# Patient Record
Sex: Female | Born: 1975 | Race: Black or African American | Hispanic: No | Marital: Single | Smoking: Current every day smoker
Health system: Southern US, Community
[De-identification: ages and names within clinical notes are randomized; demographics above are authoritative.]

## PROBLEM LIST (undated history)

## (undated) DIAGNOSIS — J45909 Unspecified asthma, uncomplicated: Secondary | ICD-10-CM

---

## 2006-06-26 ENCOUNTER — Emergency Department (HOSPITAL_COMMUNITY): Admission: EM | Admit: 2006-06-26 | Discharge: 2006-06-26 | Payer: Self-pay | Admitting: Emergency Medicine

## 2012-07-01 ENCOUNTER — Emergency Department (HOSPITAL_COMMUNITY)
Admission: EM | Admit: 2012-07-01 | Discharge: 2012-07-02 | Disposition: A | Discharge status: Home / Self Care | Payer: Self-pay | Attending: Emergency Medicine | Admitting: Emergency Medicine

## 2012-07-01 ENCOUNTER — Emergency Department (HOSPITAL_COMMUNITY): Payer: Self-pay

## 2012-07-01 ENCOUNTER — Encounter (HOSPITAL_COMMUNITY): Payer: Self-pay | Admitting: *Deleted

## 2012-07-01 DIAGNOSIS — F17200 Nicotine dependence, unspecified, uncomplicated: Secondary | ICD-10-CM | POA: Insufficient documentation

## 2012-07-01 DIAGNOSIS — J189 Pneumonia, unspecified organism: Secondary | ICD-10-CM | POA: Insufficient documentation

## 2012-07-01 DIAGNOSIS — J45909 Unspecified asthma, uncomplicated: Secondary | ICD-10-CM | POA: Insufficient documentation

## 2012-07-01 LAB — CBC WITH DIFFERENTIAL/PLATELET
Basophils Absolute: 0 10*3/uL (ref 0.0–0.1)
Basophils Relative: 0 % (ref 0–1)
MCHC: 33.9 g/dL (ref 30.0–36.0)
Neutro Abs: 6.1 10*3/uL (ref 1.7–7.7)
Neutrophils Relative %: 88 % — ABNORMAL HIGH (ref 43–77)
Platelets: 229 10*3/uL (ref 150–400)
RDW: 13.9 % (ref 11.5–15.5)

## 2012-07-01 LAB — COMPREHENSIVE METABOLIC PANEL
AST: 33 U/L (ref 0–37)
Albumin: 3.6 g/dL (ref 3.5–5.2)
Alkaline Phosphatase: 35 U/L — ABNORMAL LOW (ref 39–117)
Chloride: 99 mEq/L (ref 96–112)
Creatinine, Ser: 0.86 mg/dL (ref 0.50–1.10)
Potassium: 3.2 mEq/L — ABNORMAL LOW (ref 3.5–5.1)
Total Bilirubin: 0.3 mg/dL (ref 0.3–1.2)

## 2012-07-01 MED ORDER — ALBUTEROL SULFATE (5 MG/ML) 0.5% IN NEBU
2.5000 mg | INHALATION_SOLUTION | Freq: Once | RESPIRATORY_TRACT | Status: DC
Start: 2012-07-01 — End: 2012-07-01

## 2012-07-01 MED ORDER — IPRATROPIUM BROMIDE 0.02 % IN SOLN
RESPIRATORY_TRACT | Status: AC
  Administered 2012-07-01: 0.5 mg
  Filled 2012-07-01: qty 2.5

## 2012-07-01 MED ORDER — ALBUTEROL SULFATE (5 MG/ML) 0.5% IN NEBU
INHALATION_SOLUTION | RESPIRATORY_TRACT | Status: AC
  Administered 2012-07-01: 5 mg
  Filled 2012-07-01: qty 1

## 2012-07-01 MED ORDER — DEXTROSE 5 % IV SOLN
500.0000 mg | INTRAVENOUS | Status: DC
  Administered 2012-07-02: 500 mg via INTRAVENOUS
  Filled 2012-07-01: qty 500

## 2012-07-01 MED ORDER — DEXTROSE 5 % IV SOLN
1.0000 g | INTRAVENOUS | Status: DC
  Administered 2012-07-01: 1 g via INTRAVENOUS
  Filled 2012-07-01: qty 10

## 2012-07-01 MED ORDER — METHYLPREDNISOLONE SODIUM SUCC 125 MG IJ SOLR
125.0000 mg | Freq: Once | INTRAMUSCULAR | Status: AC
  Administered 2012-07-01: 125 mg via INTRAVENOUS
  Filled 2012-07-01: qty 2

## 2012-07-01 MED ORDER — IPRATROPIUM BROMIDE 0.02 % IN SOLN: 0.5000 mg | Freq: Once | RESPIRATORY_TRACT | Status: DC

## 2012-07-01 MED ORDER — IOHEXOL 350 MG/ML SOLN
150.0000 mL | Freq: Once | INTRAVENOUS | Status: AC | PRN
  Administered 2012-07-01: 130 mL via INTRAVENOUS

## 2012-07-01 NOTE — ED Notes (Signed)
Patient placed on O2 2l/min via [place].

## 2012-07-01 NOTE — ED Notes (Signed)
Bed:WA19<BR> Expected date:<BR> Expected time:<BR> Means of arrival:<BR> Comments:<BR> triage

## 2012-07-01 NOTE — ED Notes (Signed)
Pt c/o shortness of breath and vomiting since 0200, fever

## 2012-07-01 NOTE — ED Notes (Signed)
Spoke with Verlon Au, [place] in regards to pt care about give pt neb treatment. Informed PA of VS inspiratory and expiratory wheezing and increased respirations and heart rate. PA approved treatment with 5 albuterol and 2.5 Atrovent.

## 2012-07-01 NOTE — ED Notes (Signed)
Patient states that she is having difficulty breathing. States that she has had symptoms since 0200. Room air O2 sat 94% at this time, HR 129.

## 2012-07-01 NOTE — ED Provider Notes (Signed)
History     CSN: [id number]  Arrival date & time 07/01/12  1850   None     Chief Complaint  Patient presents with  . Shortness of Breath    (Consider location/radiation/quality/duration/timing/severity/associated sxs/prior treatment) Patient is a 36 y.o. female presenting with shortness of breath. The history is provided by the patient. No language interpreter was used.  Shortness of Breath  The current episode started yesterday. The problem occurs frequently. The problem has been gradually worsening. The problem is moderate. Nothing relieves the symptoms. Nothing aggravates the symptoms. Associated symptoms include shortness of breath. There was no intake of a foreign body. Her past medical history does not include asthma.  Pt reports she awoke from sleep coughing and vomiting yesterday.   Pt reports she has had progressive shortness of breath.   Pt reports she recently started smoking black and milds.  Pt reports she flew home from [place] last Thursday(one week ago)   Pt uses nuvaring for birthcontrol  History reviewed. No pertinent past medical history.  History reviewed. No pertinent past surgical history.  No family history on file.  History  Substance Use Topics  . Smoking status: Current Some Day Smoker  . Smokeless tobacco: Not on file  . Alcohol Use: Yes    OB History    Grav Para Term Preterm Abortions TAB SAB Ect Mult Living                  Review of Systems  Respiratory: Positive for shortness of breath.   All other systems reviewed and are negative.    Allergies  Review of patient's allergies indicates no known allergies.  Home Medications   Current Outpatient Rx  Name  Route  Sig  Dispense  Refill  . ETONOGESTREL-ETHINYL ESTRADIOL 0.12-0.015 MG/24HR VA RING   Vaginal   Place 1 each vaginally every 28 (twenty-eight) days. Insert vaginally and leave in place for 3 consecutive weeks, then remove for 1 week.         Marland Kitchen COLD AND FLU PO   Oral   Take 2 tablets by mouth 2 (two) times daily as needed. For cold symptoms/body ache/temperature.           BP 142/87  Pulse 118  Temp 100.5 F (38.1 C) (Oral)  Resp 18  Ht 5\' 3"  (1.6 m)  Wt 170 lb (77.111 kg)  BMI 30.11 kg/m2  SpO2 92%  LMP 06/08/2012  Physical Exam  Vitals reviewed. Constitutional: She is oriented to person, place, and time. She appears well-developed and well-nourished.  HENT:  Head: Normocephalic and atraumatic.  Right Ear: External ear normal.  Left Ear: External ear normal.  Nose: Nose normal.  Mouth/Throat: Oropharynx is clear and moist.  Eyes: Conjunctivae normal and EOM are normal. Pupils are equal, round, and reactive to light.  Neck: Normal range of motion. Neck supple.  Cardiovascular: Normal heart sounds.        tachycardia  Pulmonary/Chest: Effort normal. She has wheezes.  Abdominal: Soft. Bowel sounds are normal.  Musculoskeletal: Normal range of motion.  Neurological: She is alert and oriented to person, place, and time. She has normal reflexes.  Skin: Skin is warm.  Psychiatric: She has a normal mood and affect.    ED Course  Procedures (including critical care time)  Labs Reviewed - No data to display No results found.   No diagnosis found.  Date: 07/02/2012  Rate: 117  Rhythm: sinus tachycardia  QRS Axis: normal  Intervals: normal  ST/T Wave abnormalities: nonspecific T wave changes  Conduction Disutrbances:none  Narrative Interpretation:   Old EKG Reviewed: none available    MDM  Pt given albuterol and atrovent neb.   Pt feels some better.   Chest xray shows possible pneumonia.   I am concerned about PE. Due to travel and symptoms.   Chest Ct/angio shows no pe.   Pt given zithromax and rocephin and solumedrol.    Pt feels better.   Pt given second neb treatment and observed extended period of time.   Pt given albuterol inhaler,  rx for prednisone and zithromazx        Lonia Skinner [place], [place] 07/02/12 [phone]

## 2012-07-01 NOTE — ED Notes (Signed)
Phlebotomy at beside for lab draw.Marland Kitchen

## 2012-07-02 MED ORDER — IPRATROPIUM BROMIDE 0.02 % IN SOLN
0.5000 mg | Freq: Once | RESPIRATORY_TRACT | Status: AC
  Administered 2012-07-02: 0.5 mg via RESPIRATORY_TRACT

## 2012-07-02 MED ORDER — PREDNISONE 10 MG PO TABS: ORAL_TABLET | ORAL | Status: DC

## 2012-07-02 MED ORDER — ALBUTEROL SULFATE HFA 108 (90 BASE) MCG/ACT IN AERS
2.0000 | INHALATION_SPRAY | Freq: Once | RESPIRATORY_TRACT | Status: AC
Start: 2012-07-02 — End: 2012-07-02
  Administered 2012-07-02: 2 via RESPIRATORY_TRACT
  Filled 2012-07-02: qty 6.7

## 2012-07-02 MED ORDER — ALBUTEROL SULFATE HFA 108 (90 BASE) MCG/ACT IN AERS: 1.0000 | INHALATION_SPRAY | Freq: Four times a day (QID) | RESPIRATORY_TRACT | Status: DC | PRN

## 2012-07-02 MED ORDER — IPRATROPIUM BROMIDE 0.02 % IN SOLN
RESPIRATORY_TRACT | Status: AC
  Filled 2012-07-02: qty 2.5

## 2012-07-02 MED ORDER — AEROCHAMBER Z-STAT PLUS/MEDIUM MISC
1.0000 | Freq: Once | Status: AC
  Administered 2012-07-02: 1

## 2012-07-02 MED ORDER — AZITHROMYCIN 250 MG PO TABS: ORAL_TABLET | ORAL | Status: DC

## 2012-07-02 MED ORDER — ALBUTEROL SULFATE (5 MG/ML) 0.5% IN NEBU
INHALATION_SOLUTION | RESPIRATORY_TRACT | Status: AC
  Filled 2012-07-02: qty 1

## 2012-07-02 MED ORDER — ALBUTEROL SULFATE (5 MG/ML) 0.5% IN NEBU
5.0000 mg | INHALATION_SOLUTION | Freq: Once | RESPIRATORY_TRACT | Status: AC
  Administered 2012-07-02: 5 mg via RESPIRATORY_TRACT

## 2012-07-02 NOTE — ED Provider Notes (Signed)
Medical screening examination/treatment/procedure(s) were performed by non-physician practitioner and as supervising physician I was immediately available for consultation/collaboration.   Rolan Bucco, MD 07/02/12 [phone]

## 2012-07-08 ENCOUNTER — Observation Stay (HOSPITAL_COMMUNITY)
Admission: EM | Admit: 2012-07-08 | Discharge: 2012-07-09 | Disposition: A | Discharge status: Home / Self Care | Payer: BC Managed Care – PPO | Attending: Emergency Medicine | Admitting: Emergency Medicine

## 2012-07-08 ENCOUNTER — Encounter (HOSPITAL_COMMUNITY): Payer: Self-pay | Admitting: *Deleted

## 2012-07-08 DIAGNOSIS — F17200 Nicotine dependence, unspecified, uncomplicated: Secondary | ICD-10-CM | POA: Insufficient documentation

## 2012-07-08 DIAGNOSIS — J45901 Unspecified asthma with (acute) exacerbation: Secondary | ICD-10-CM | POA: Insufficient documentation

## 2012-07-08 DIAGNOSIS — R059 Cough, unspecified: Secondary | ICD-10-CM | POA: Insufficient documentation

## 2012-07-08 DIAGNOSIS — J40 Bronchitis, not specified as acute or chronic: Secondary | ICD-10-CM

## 2012-07-08 DIAGNOSIS — R079 Chest pain, unspecified: Secondary | ICD-10-CM | POA: Insufficient documentation

## 2012-07-08 DIAGNOSIS — R0602 Shortness of breath: Principal | ICD-10-CM | POA: Insufficient documentation

## 2012-07-08 DIAGNOSIS — R9431 Abnormal electrocardiogram [ECG] [EKG]: Secondary | ICD-10-CM | POA: Insufficient documentation

## 2012-07-08 DIAGNOSIS — R05 Cough: Secondary | ICD-10-CM | POA: Insufficient documentation

## 2012-07-08 DIAGNOSIS — Z79899 Other long term (current) drug therapy: Secondary | ICD-10-CM | POA: Insufficient documentation

## 2012-07-08 HISTORY — DX: Unspecified asthma, uncomplicated: J45.909

## 2012-07-08 LAB — CBC WITH DIFFERENTIAL/PLATELET
Basophils Absolute: 0 10*3/uL (ref 0.0–0.1)
Eosinophils Relative: 1 % (ref 0–5)
HCT: 36.4 % (ref 36.0–46.0)
Hemoglobin: 12.6 g/dL (ref 12.0–15.0)
Lymphocytes Relative: 30 % (ref 12–46)
MCV: 86.7 fL (ref 78.0–100.0)
Monocytes Absolute: 0.8 10*3/uL (ref 0.1–1.0)
Monocytes Relative: 10 % (ref 3–12)
RDW: 13.4 % (ref 11.5–15.5)
WBC: 8.3 10*3/uL (ref 4.0–10.5)

## 2012-07-08 LAB — RAPID URINE DRUG SCREEN, HOSP PERFORMED
Barbiturates: NOT DETECTED
Cocaine: NOT DETECTED
Tetrahydrocannabinol: POSITIVE — AB

## 2012-07-08 LAB — PRO B NATRIURETIC PEPTIDE: Pro B Natriuretic peptide (BNP): 25.2 pg/mL (ref 0–125)

## 2012-07-08 MED ORDER — ALBUTEROL SULFATE (5 MG/ML) 0.5% IN NEBU
5.0000 mg | INHALATION_SOLUTION | Freq: Once | RESPIRATORY_TRACT | Status: AC
  Administered 2012-07-08: 5 mg via RESPIRATORY_TRACT
  Filled 2012-07-08: qty 1

## 2012-07-08 MED ORDER — PREDNISONE 20 MG PO TABS
60.0000 mg | ORAL_TABLET | Freq: Once | ORAL | Status: AC
  Administered 2012-07-08: 60 mg via ORAL
  Filled 2012-07-08: qty 3

## 2012-07-08 MED ORDER — IPRATROPIUM BROMIDE 0.02 % IN SOLN
0.5000 mg | Freq: Once | RESPIRATORY_TRACT | Status: AC
  Administered 2012-07-08: 0.5 mg via RESPIRATORY_TRACT
  Filled 2012-07-08: qty 2.5

## 2012-07-08 MED ORDER — ALBUTEROL (5 MG/ML) CONTINUOUS INHALATION SOLN
10.0000 mg/h | INHALATION_SOLUTION | Freq: Once | RESPIRATORY_TRACT | Status: AC
  Administered 2012-07-08: 10 mg/h via RESPIRATORY_TRACT

## 2012-07-08 NOTE — ED Notes (Signed)
Family at bedside. 

## 2012-07-08 NOTE — ED Notes (Signed)
Tabatha, RT called for peak flow and breathing tx, will monitor.

## 2012-07-08 NOTE — ED Provider Notes (Signed)
History     CSN: [id number]  Arrival date & time 07/08/12  1725   First MD Initiated Contact with Patient 07/08/12 1752      Chief Complaint  Patient presents with  . Shortness of Breath  . Cough    (Consider location/radiation/quality/duration/timing/severity/associated sxs/prior treatment) HPI Comments: Pt comes in with cc of of SOB. Pt has no medical hx, was seen few days ago with same complain, had CT PE and when that was found to be normal, patient was discharged home with AB and inhaler. Pt reports that over the past few days, her dib has worsened again, and that her symptoms are worse at night. She denies orthopnea, or PND though. She has mild chest pain assoicated with the sob. She has no hx of lung disease, is not a smoker anymore, and is not exposed to secondary smoking.    Patient is a 36 y.o. female presenting with shortness of breath and cough. The history is provided by the patient.  Shortness of Breath  Associated symptoms include chest pain, cough and shortness of breath.  Cough Associated symptoms include chest pain and shortness of breath. Pertinent negatives include no headaches.    Past Medical History  Diagnosis Date  . Asthma     History reviewed. No pertinent past surgical history.  History reviewed. No pertinent family history.  History  Substance Use Topics  . Smoking status: Current Some Day Smoker  . Smokeless tobacco: Never Used  . Alcohol Use: Yes    OB History    Grav Para Term Preterm Abortions TAB SAB Ect Mult Living                  Review of Systems  Constitutional: Negative for activity change.  HENT: Negative for neck pain.   Respiratory: Positive for cough and shortness of breath.   Cardiovascular: Positive for chest pain.  Gastrointestinal: Negative for nausea, vomiting and abdominal pain.  Genitourinary: Negative for dysuria.  Neurological: Negative for headaches.    Allergies  Review of patient's allergies indicates  no known allergies.  Home Medications   Current Outpatient Rx  Name  Route  Sig  Dispense  Refill  . ALBUTEROL SULFATE HFA 108 (90 BASE) MCG/ACT IN AERS   Inhalation   Inhale 1-2 puffs into the lungs every 6 (six) hours as needed. Wheezing         . ETONOGESTREL-ETHINYL ESTRADIOL 0.12-0.015 MG/24HR VA RING   Vaginal   Place 1 each vaginally every 28 (twenty-eight) days. Insert vaginally and leave in place for 3 consecutive weeks, then remove for 1 week.         . GUAIFENESIN ER 600 MG PO TB12   Oral   Take 1,200 mg by mouth 2 (two) times daily.           BP 124/88  Pulse 95  Temp 99.7 F (37.6 C) (Oral)  Resp 16  SpO2 98%  LMP 06/10/2012  Physical Exam  Nursing note and vitals reviewed. Constitutional: She is oriented to person, place, and time. She appears well-developed and well-nourished.  HENT:  Head: Normocephalic and atraumatic.  Eyes: EOM are normal. Pupils are equal, round, and reactive to light.  Neck: Neck supple.  Cardiovascular: Regular rhythm and normal heart sounds.   No murmur heard. Pulmonary/Chest: Effort normal. No respiratory distress. She has wheezes.  Abdominal: Soft. She exhibits no distension. There is no tenderness. There is no rebound and no guarding.  Neurological: She is alert and  oriented to person, place, and time.  Skin: Skin is warm and dry.    ED Course  Procedures (including critical care time)  Labs Reviewed - No data to display No results found.   No diagnosis found.    MDM  Differential diagnosis includes: ACS syndrome CHF exacerbation Valvular disorder Myocarditis Pericarditis Pericardial effusion Pneumonia Pleural effusion Pulmonary edema PE Anemia Musculoskeletal pain  Pt comes in with cc of sob. Pt had a negative PE workup last time she was here. With deep inspiration, she has inspiratory and expiratory wheez, fine. Will give breathing tx and reassess.  9:33 PM Pt upon reassessment has normal lung  exam, but does appear to be slightly tachypneic. With a negative Ct PE few days back, i dont think there is any anatomical problem in the lung, pericardial effusion. Not sure if she has any lung functional lung disease. Will get repeat EKG and troponin this time, and reassess. Has insurance, just no PCP.        Derwood Kaplan, MD 07/08/12 2135

## 2012-07-08 NOTE — ED Notes (Signed)
Pt c/o shortness of breath and states "my chest is on fire." Symptoms since last Friday. Pt seen for same last Friday. Pt diagnosed with asthma last Friday. Pt speaks in short choppy sentences with high pitched voice. No audible wheezing noted. Pt with no acute distress. Skin, warm and dry. Pt states she has had a cough which is productive at times.

## 2012-07-08 NOTE — ED Notes (Signed)
MD at bedside. 

## 2012-07-09 ENCOUNTER — Encounter (HOSPITAL_COMMUNITY): Payer: Self-pay | Admitting: *Deleted

## 2012-07-09 ENCOUNTER — Observation Stay (HOSPITAL_COMMUNITY): Payer: BC Managed Care – PPO

## 2012-07-09 LAB — POCT I-STAT, CHEM 8
Calcium, Ion: 1.12 mmol/L (ref 1.12–1.23)
Chloride: 105 mEq/L (ref 96–112)
Glucose, Bld: 112 mg/dL — ABNORMAL HIGH (ref 70–99)
HCT: 37 % (ref 36.0–46.0)
TCO2: 25 mmol/L (ref 0–100)

## 2012-07-09 LAB — POCT I-STAT TROPONIN I
Troponin i, poc: 0 ng/mL (ref 0.00–0.08)
Troponin i, poc: 0 ng/mL (ref 0.00–0.08)

## 2012-07-09 MED ORDER — HYDROCOD POLST-CHLORPHEN POLST 10-8 MG/5ML PO LQCR: 5.0000 mL | Freq: Two times a day (BID) | ORAL | Status: DC | PRN

## 2012-07-09 NOTE — ED Provider Notes (Signed)
7:11 AM Patient is under CDU observation, chest pain protocol.  This is a shared visit with Dr Rhunette Croft.  Sign out received from Dr Nicanor Alcon.  Patient with hx of asthma, being treated for bronchospasm, scheduled for coronary CT.  However, given patient's asthma and active wheezing, unable to give patient beta blockers required for this test.  Exercise stress test not available as it is Saturday.  Plan is for cardiology consult.    7:49 AM Upon review of EPIC charts, patient does not have hx of asthma, though she has been wheezing recently.  Had CXR 11/16 concerning for pneumonia and CT angio that was negative for PE.  Seen yesterday at [facility] for continued symptoms.    7:59 AM Patient reports she feels "10 times better" than when she came in.  Denies CP.  States she has had intermittent SOB x 3 days, worse at night and with coughing.  Chest pain occurs only with SOB or coughing, is described as burning sensation.  Pt states she did initially improve with antibiotics and steroids before getting worse again.  Pt has not had a chest xray since initial visit 11/16.  I have ordered CXR.   Patient is A&Ox4, NAD, RRR, no m/r/g, chest nontender, lungs with mild expiratory wheezes on right, deep cough, abd soft, NT, extremities without edema, distal pulses intact.   9:35 AM I spoke with Dr Shirlee Latch, on call for [facility] cardiology, who recommends ruling out with repeat troponins, follow up in the office for outpatient study.    9:48 AM Discussed patient with Dr Ranae Palms, who agrees with Dr Alford Highland recommendation.  Pt has had three negative troponins.  Pt to be d/c home with treatment for bronchitis.  I discussed this with patient who agrees with plan, declines steroids, states she cannot tolerate the side effects.    Pt given outpt follow up with cardiology, encouraged to find PCP for follow up (Pt has insurance and states she understands how to accomplish this). Discussed all results with patient.  Pt  given return precautions.  Pt verbalizes understanding and agrees with plan.     Results for orders placed during the hospital encounter of 07/08/12  CBC WITH DIFFERENTIAL      Component Value Range   WBC 8.3  4.0 - 10.5 K/uL   RBC 4.20  3.87 - 5.11 MIL/uL   Hemoglobin 12.6  12.0 - 15.0 g/dL   HCT 04.5  40.9 - 81.1 %   MCV 86.7  78.0 - 100.0 fL   MCH 30.0  26.0 - 34.0 pg   MCHC 34.6  30.0 - 36.0 g/dL   RDW 91.4  78.2 - 95.6 %   Platelets 279  150 - 400 K/uL   Neutrophils Relative 59  43 - 77 %   Neutro Abs 4.9  1.7 - 7.7 K/uL   Lymphocytes Relative 30  12 - 46 %   Lymphs Abs 2.5  0.7 - 4.0 K/uL   Monocytes Relative 10  3 - 12 %   Monocytes Absolute 0.8  0.1 - 1.0 K/uL   Eosinophils Relative 1  0 - 5 %   Eosinophils Absolute 0.1  0.0 - 0.7 K/uL   Basophils Relative 0  0 - 1 %   Basophils Absolute 0.0  0.0 - 0.1 K/uL  PRO B NATRIURETIC PEPTIDE      Component Value Range   Pro B Natriuretic peptide (BNP) 25.2  0 - 125 pg/mL  TROPONIN I  Component Value Range   Troponin I <0.30  <0.30 ng/mL  URINE RAPID DRUG SCREEN (HOSP PERFORMED)      Component Value Range   Opiates NONE DETECTED  NONE DETECTED   Cocaine NONE DETECTED  NONE DETECTED   Benzodiazepines NONE DETECTED  NONE DETECTED   Amphetamines NONE DETECTED  NONE DETECTED   Tetrahydrocannabinol POSITIVE (*) NONE DETECTED   Barbiturates NONE DETECTED  NONE DETECTED  POCT I-STAT TROPONIN I      Component Value Range   Troponin i, poc 0.00  0.00 - 0.08 ng/mL   Comment 3           POCT I-STAT TROPONIN I      Component Value Range   Troponin i, poc 0.00  0.00 - 0.08 ng/mL   Comment 3           POCT I-STAT, CHEM 8      Component Value Range   Sodium 138  135 - 145 mEq/L   Potassium 3.8  3.5 - 5.1 mEq/L   Chloride 105  96 - 112 mEq/L   BUN 7  6 - 23 mg/dL   Creatinine, Ser 1.61  0.50 - 1.10 mg/dL   Glucose, Bld 096 (*) 70 - 99 mg/dL   Calcium, Ion 0.45  4.09 - 1.23 mmol/L   TCO2 25  0 - 100 mmol/L   Hemoglobin 12.6   12.0 - 15.0 g/dL   HCT 81.1  91.4 - 78.2 %   Dg Chest 2 View  07/09/2012  *RADIOLOGY REPORT*  Clinical Data: Shortness of breath, cough  CHEST - 2 VIEW  Comparison:  07/01/2012  Findings:  The heart size and mediastinal contours are within normal limits.  Both lungs are clear.  The visualized skeletal structures are unremarkable.  IMPRESSION: No active cardiopulmonary disease.   Original Report Authenticated By: Judie Petit. Miles Costain, M.D.      [place], [place] 07/09/12 1020

## 2012-07-09 NOTE — ED Notes (Signed)
Report given to [facility] upon arrival. Pt transferred via [facility] to CDU at [facility] with chart and personal belongings, condition stable at time of discharge.

## 2012-07-09 NOTE — ED Notes (Addendum)
Attempted to call report twice to CDU at [facility] but no answer.

## 2012-07-09 NOTE — ED Notes (Signed)
Pt ambulated to bathroom with NT 

## 2012-07-09 NOTE — ED Notes (Signed)
Received pt from [facility] ED via [facility] for observation/chest pain protocol. Report from bridget, rn prior to arrival

## 2012-07-09 NOTE — ED Notes (Signed)
Call from CT re: ordered test and pt unable to take Lopressor. Pt will not be able to have test if unable to take lopressor to decrease heart rate. Dr. Nicanor Alcon updated.

## 2012-07-09 NOTE — ED Provider Notes (Signed)
Medical screening examination/treatment/procedure(s) were conducted as a shared visit with non-physician practitioner(s) and myself.  I personally evaluated the patient during the encounter.  Derwood Kaplan, MD 07/09/12 [phone]

## 2012-07-09 NOTE — ED Notes (Signed)
History of Asthma -  No lopressor given per hold orders for CT

## 2012-07-09 NOTE — ED Notes (Signed)
Dr. Nicanor Alcon updated re: pt arrival from [facility] and pt location.

## 2012-07-11 ENCOUNTER — Telehealth: Payer: Self-pay

## 2012-07-11 NOTE — Telephone Encounter (Signed)
New Problem:    I attempted to call the patient to schedule their Myoview and follow-up with Dr. Shirlee Latch per Shanda Bumps PA form of After Hours Voicemail and was unable to reach the patient.  Their phone just rang continuously without leaving an option to leave a message.

## 2012-07-18 ENCOUNTER — Ambulatory Visit (HOSPITAL_COMMUNITY): Payer: BC Managed Care – PPO | Attending: Cardiology | Admitting: Radiology

## 2012-07-18 VITALS — BP 98/54 | Ht 63.0 in | Wt 176.0 lb

## 2012-07-18 DIAGNOSIS — J45909 Unspecified asthma, uncomplicated: Secondary | ICD-10-CM | POA: Insufficient documentation

## 2012-07-18 DIAGNOSIS — R0602 Shortness of breath: Secondary | ICD-10-CM

## 2012-07-18 DIAGNOSIS — R079 Chest pain, unspecified: Secondary | ICD-10-CM | POA: Insufficient documentation

## 2012-07-18 MED ORDER — TECHNETIUM TC 99M SESTAMIBI GENERIC - CARDIOLITE
30.0000 | Freq: Once | INTRAVENOUS | Status: AC | PRN
  Administered 2012-07-18: 30 via INTRAVENOUS

## 2012-07-18 MED ORDER — TECHNETIUM TC 99M SESTAMIBI GENERIC - CARDIOLITE
10.0000 | Freq: Once | INTRAVENOUS | Status: AC | PRN
  Administered 2012-07-18: 10 via INTRAVENOUS

## 2012-07-18 NOTE — Progress Notes (Addendum)
[facility] SITE 3 NUCLEAR MED [address] [id number] [place] [address] [phone]  Cardiology Nuclear Med Study  Allison Cross is a 36 y.o. female     MRN : [id number]     DOB: 1976-02-28  Procedure Date: 07/18/2012  Nuclear Med Background Indication for Stress Test:  Evaluation for Ischemia and [facility] 11/23, 11/22, and 11/15 for SOB with assoc, chest pain; negative enzymes/EKG; being treated for bronchitis) History:  Asthma Cardiac Risk Factors: History of Smoking  Symptoms:  Chest Pain and SOB   Nuclear Pre-Procedure Caffeine/Decaff Intake:  7:00pm NPO After: 7:00pm   Lungs:  clear O2 Sat: 98% on room air. IV 0.9% NS with Angio Cath:  22g  IV Site: R Hand  IV Started by:  Cathlyn Parsons, RN  Chest Size (in):  38 Cup Size: DD  Height: 5\' 3"  (1.6 m)  Weight:  176 lb (79.833 kg)  BMI:  Body mass index is 31.18 kg/(m^2). Tech Comments:  No meds held this am    Nuclear Med Study 1 or 2 day study: 1 day  Stress Test Type:  Stress  Reading MD: Willa Rough, MD  Order Authorizing Provider:  Golden Circle, MD  Resting Radionuclide: Technetium 24m Sestamibi  Resting Radionuclide Dose: 10.7 mCi   Stress Radionuclide:  Technetium 53m Sestamibi  Stress Radionuclide Dose: 33.0 mCi           Stress Protocol Rest HR: 65 Stress HR: 164  Rest BP: 98/54 Stress BP: 157/67  Exercise Time (min): 9:45 METS: 10.20          Dose of Adenosine (mg):  n/a Dose of Lexiscan: n/a mg  Dose of Atropine (mg): n/a Dose of Dobutamine: n/a mcg/kg/min (at max HR)  Stress Test Technologist: Milana Na, EMT-P  Nuclear Technologist:  Domenic Polite, CNMT     Rest Procedure:  Myocardial perfusion imaging was performed at rest 45 minutes following the intravenous administration of Technetium 66m Sestamibi. Rest ECG: NSR - Normal EKG  Stress Procedure:  The patient performed treadmill exercise using a Bruce  Protocol for 9:45 minutes. The patient  stopped due to fatigue, sob,  and denied any chest pain.  There were no significant ST-T wave changes and a rare pvc.  Technetium 47m Sestamibi was injected at peak exercise and myocardial perfusion imaging was performed after a brief delay. Stress ECG: No significant change from baseline ECG  QPS Raw Data Images:  Patient motion noted; appropriate software correction applied. Stress Images:  Very slight ( in significant) decreased uptake at the apical cap. Rest Images:  There is interference from nuclear activity from structures below the diaphragm. This affects the rest images. Subtraction (SDS):  No evidence of ischemia. Transient Ischemic Dilatation (Normal <1.22):  1.18 Lung/Heart Ratio (Normal <0.45):  0.44  Quantitative Gated Spect Images QGS EDV:  113 ml QGS ESV:  59 ml  Impression Exercise Capacity:  Fair exercise capacity. BP Response:  Normal blood pressure response. Clinical Symptoms:  shortness of breath ECG Impression:  No significant ST segment change suggestive of ischemia. Comparison with Prior Nuclear Study: No images to compare  Overall Impression:  There are no diagnostic abnormalities. There is no definite scar or ischemia. There is interference from nuclear activity from structures below the diaphragm affecting the rest images. This does not affect the ability to read the study as the stress images are normal. The ejection fraction is low normal for this technology.  LV Ejection Fraction: 48%.  LV Wall Motion:   Overall ejection fraction is low normal. There are no focal wall motion abnormalities.  Allison Cross   EF mildly decreased, no evidence for ischemia or infarction.  She will need an echocardiogram to confirm EF.   Allison Cross 07/20/2012

## 2012-07-21 ENCOUNTER — Encounter: Payer: Self-pay | Admitting: Internal Medicine

## 2012-07-21 ENCOUNTER — Other Ambulatory Visit: Payer: Self-pay | Admitting: *Deleted

## 2012-07-21 DIAGNOSIS — R079 Chest pain, unspecified: Secondary | ICD-10-CM

## 2012-07-21 NOTE — Progress Notes (Signed)
Pt.notified

## 2012-08-05 ENCOUNTER — Other Ambulatory Visit (HOSPITAL_COMMUNITY): Payer: BC Managed Care – PPO

## 2012-08-05 ENCOUNTER — Encounter: Payer: BC Managed Care – PPO | Admitting: Cardiology

## 2014-05-14 ENCOUNTER — Emergency Department (HOSPITAL_COMMUNITY): Payer: BC Managed Care – PPO

## 2014-05-14 ENCOUNTER — Emergency Department (HOSPITAL_COMMUNITY)
Admission: EM | Admit: 2014-05-14 | Discharge: 2014-05-14 | Disposition: A | Discharge status: Home / Self Care | Payer: BC Managed Care – PPO | Attending: Emergency Medicine | Admitting: Emergency Medicine

## 2014-05-14 ENCOUNTER — Encounter (HOSPITAL_COMMUNITY): Payer: Self-pay | Admitting: Emergency Medicine

## 2014-05-14 DIAGNOSIS — R4182 Altered mental status, unspecified: Secondary | ICD-10-CM | POA: Insufficient documentation

## 2014-05-14 DIAGNOSIS — F17200 Nicotine dependence, unspecified, uncomplicated: Secondary | ICD-10-CM | POA: Insufficient documentation

## 2014-05-14 DIAGNOSIS — Z79899 Other long term (current) drug therapy: Secondary | ICD-10-CM | POA: Diagnosis not present

## 2014-05-14 DIAGNOSIS — J45909 Unspecified asthma, uncomplicated: Secondary | ICD-10-CM | POA: Diagnosis present

## 2014-05-14 DIAGNOSIS — J309 Allergic rhinitis, unspecified: Secondary | ICD-10-CM | POA: Insufficient documentation

## 2014-05-14 DIAGNOSIS — J302 Other seasonal allergic rhinitis: Secondary | ICD-10-CM

## 2014-05-14 DIAGNOSIS — J45901 Unspecified asthma with (acute) exacerbation: Secondary | ICD-10-CM | POA: Insufficient documentation

## 2014-05-14 MED ORDER — LORATADINE 10 MG PO TABS: 10.0000 mg | ORAL_TABLET | Freq: Every day | ORAL | Status: AC

## 2014-05-14 MED ORDER — IPRATROPIUM-ALBUTEROL 0.5-2.5 (3) MG/3ML IN SOLN
3.0000 mL | Freq: Once | RESPIRATORY_TRACT | Status: AC
  Administered 2014-05-14: 3 mL via RESPIRATORY_TRACT
  Filled 2014-05-14: qty 3

## 2014-05-14 MED ORDER — PREDNISONE 20 MG PO TABS: 40.0000 mg | ORAL_TABLET | Freq: Every day | ORAL | Status: AC

## 2014-05-14 MED ORDER — ALBUTEROL SULFATE (2.5 MG/3ML) 0.083% IN NEBU
5.0000 mg | INHALATION_SOLUTION | Freq: Once | RESPIRATORY_TRACT | Status: AC
  Administered 2014-05-14: 5 mg via RESPIRATORY_TRACT
  Filled 2014-05-14: qty 6

## 2014-05-14 MED ORDER — ALBUTEROL SULFATE HFA 108 (90 BASE) MCG/ACT IN AERS
2.0000 | INHALATION_SPRAY | Freq: Once | RESPIRATORY_TRACT | Status: AC
  Administered 2014-05-14: 2 via RESPIRATORY_TRACT
  Filled 2014-05-14: qty 6.7

## 2014-05-14 MED ORDER — ALBUTEROL SULFATE (2.5 MG/3ML) 0.083% IN NEBU
2.5000 mg | INHALATION_SOLUTION | Freq: Once | RESPIRATORY_TRACT | Status: AC
  Administered 2014-05-14: 2.5 mg via RESPIRATORY_TRACT
  Filled 2014-05-14: qty 3

## 2014-05-14 MED ORDER — PREDNISONE 20 MG PO TABS
60.0000 mg | ORAL_TABLET | Freq: Once | ORAL | Status: AC
  Administered 2014-05-14: 60 mg via ORAL
  Filled 2014-05-14: qty 3

## 2014-05-14 NOTE — ED Provider Notes (Signed)
CSN: [id number]     Arrival date & time 05/14/14  0319 History   First MD Initiated Contact with Patient 05/14/14 0326     Chief Complaint  Patient presents with  . Asthma    (Consider location/radiation/quality/duration/timing/severity/associated sxs/prior Treatment) HPI Comments: Patient is a 38 year old female who presents to the emergency department for shortness of breath. Patient states that her shortness of breath has been worsening over the last 24 hours. She states that she has been using an inhaler at home as well as Vicks vapor rub without relief. Patient states that symptoms have been associated with nasal congestion as well as congested, nonproductive cough, and postnasal drip. No fevers, hemoptysis, leg swelling, recent surgeries or hospitalizations, nausea, vomiting, or chest pain. Patient states that she recently traveled home from [place] last week. She states that she has had similar symptoms last fall which improved with asthma medication. She denies a known history of seasonal allergies.  Patient is a 38 y.o. female presenting with asthma. The history is provided by the patient. No language interpreter was used.  Asthma Associated symptoms include congestion, coughing and fatigue. Pertinent negatives include no chest pain, fever or weakness.    Past Medical History  Diagnosis Date  . Asthma    No past surgical history on file. No family history on file. History  Substance Use Topics  . Smoking status: Current Every Day Smoker -- 0.20 packs/day    Types: Cigarettes  . Smokeless tobacco: Never Used  . Alcohol Use: Yes     Comment: socially   OB History   Grav Para Term Preterm Abortions TAB SAB Ect Mult Living                  Review of Systems  Constitutional: Positive for fatigue. Negative for fever.  HENT: Positive for congestion and postnasal drip. Negative for trouble swallowing.   Respiratory: Positive for cough, chest tightness and shortness of  breath.   Cardiovascular: Negative for chest pain.  Neurological: Negative for syncope and weakness.  All other systems reviewed and are negative.   Allergies  Review of patient's allergies indicates no known allergies.  Home Medications   Prior to Admission medications   Medication Sig Start Date End Date Taking? Authorizing Provider  albuterol (PROVENTIL HFA;VENTOLIN HFA) 108 (90 BASE) MCG/ACT inhaler Inhale 1-2 puffs into the lungs every 6 (six) hours as needed. Wheezing 07/02/12  Yes Elson Areas, PA-C  DM-Phenylephrine-Acetaminophen (TYLENOL COLD MULTI-SYMPTOM) 10-5-325 MG/15ML LIQD Take 5 mLs by mouth every 6 (six) hours as needed (cough).   Yes Historical Provider, MD  etonogestrel-ethinyl estradiol (NUVARING) 0.12-0.015 MG/24HR vaginal ring Place 1 each vaginally every 28 (twenty-eight) days. Insert vaginally and leave in place for 3 consecutive weeks, then remove for 1 week.   Yes Historical Provider, MD   BP 129/96  Pulse 77  Temp(Src) 98.1 F (36.7 C) (Oral)  Resp 24  SpO2 98%  LMP 04/15/2014  Physical Exam  Nursing note and vitals reviewed. Constitutional: She is oriented to person, place, and time. She appears well-developed and well-nourished. No distress.  Nontoxic/nonseptic appearing  HENT:  Head: Normocephalic and atraumatic.  Eyes: Conjunctivae and EOM are normal. No scleral icterus.  Neck: Normal range of motion.  Cardiovascular: Normal rate, regular rhythm and normal heart sounds.   Pulmonary/Chest: No respiratory distress. She has wheezes. She has no rales.  Patient with dyspnea and tachypnea on arrival. Patient speaking in truncated sentences. Mild expiratory wheezing and diffuse rhonchorous breath  sounds appreciated. Chest expansion symmetric. No retractions or accessory muscle use.  Abdominal: Soft. She exhibits no distension. There is no tenderness.  Soft, nontender  Musculoskeletal: Normal range of motion.  Neurological: She is alert and oriented to  person, place, and time. She exhibits normal muscle tone. Coordination normal.  Skin: Skin is warm and dry. No rash noted. She is not diaphoretic. No erythema. No pallor.  Psychiatric: She has a normal mood and affect. Her behavior is normal.    ED Course  Procedures (including critical care time) Labs Review Labs Reviewed - No data to display  Imaging Review Dg Chest 2 View  05/14/2014   CLINICAL DATA:  Shortness of breath.  Asthma.  EXAM: CHEST  2 VIEW  COMPARISON:  07/09/2012  FINDINGS: Mild hyperinflation. The heart size and mediastinal contours are within normal limits. Both lungs are clear. The visualized skeletal structures are unremarkable.  IMPRESSION: No active cardiopulmonary disease.   Electronically Signed   By: Burman Nieves M.D.   On: 05/14/2014 04:34     EKG Interpretation None      MDM   Final diagnoses:  Asthma exacerbation  Seasonal allergies    Patient is a 38 year old female who presents to the emergency department for further evaluation of shortness of breath and chest tightness with cough. Symptoms associated with upper respiratory symptoms. Patient speaking in truncated sentences on arrival. She exhibits tachypnea and dyspnea without retractions or accessory muscle use. No evidence of acute respiratory distress. Patient satting well at 99% on room air on arrival.  Patient treated in ED with albuterol neb as well as DuoNeb and oral steroids. Patient has had significant improvement with these treatments. Lungs on reexamination no longer with expiratory wheezing and rhonchi; lungs now clear to auscultation bilaterally. Chest expansion symmetric. Patient speaking in full sentences. Patient ambulates in the ED without hypoxia. She states that she feels as though she is breathing at baseline.  Patient stable and appropriate for discharge with prescription for prednisone. Will also give albuterol inhaler. Suspect that symptoms have an underlying seasonal allergy  component; therefore, will start on Claritin. Return precautions discussed and provided. Patient agreeable to plan with no unaddressed concerns. Patient discharged in good condition; VSS.   Filed Vitals:   05/14/14 0333 05/14/14 0343 05/14/14 0407 05/14/14 0456  BP:    116/68  Pulse:  77  86  Temp:    98.1 F (36.7 C)  TempSrc:    Oral  Resp:  24  20  SpO2: 99% 98% 98% 97%      Antony Madura, PA-C 05/14/14 [phone]

## 2014-05-14 NOTE — ED Provider Notes (Signed)
Medical screening examination/treatment/procedure(s) were performed by non-physician practitioner and as supervising physician I was immediately available for consultation/collaboration.   EKG Interpretation None        Damin Salido K Lebaron Bautch-Rasch, MD 05/14/14 [phone]

## 2014-05-14 NOTE — ED Notes (Signed)
Ambulated pt around nursing station, O2 sats stayed between 97-100% on RA.

## 2014-05-14 NOTE — ED Notes (Signed)
Patient states she was recently traveling to [place] last week, started feeling badly on Wednesday and worsening overnight. Patient states she has been using her inhaler at home and Vicks Vapor Rub, as well as a store brand decongestant. SPO2 99% on RA.

## 2014-05-14 NOTE — Discharge Instructions (Signed)

## 2015-10-20 ENCOUNTER — Emergency Department (HOSPITAL_COMMUNITY)
Admission: EM | Admit: 2015-10-20 | Discharge: 2015-10-20 | Disposition: A | Discharge status: Home / Self Care | Payer: BLUE CROSS/BLUE SHIELD | Attending: Emergency Medicine | Admitting: Emergency Medicine

## 2015-10-20 ENCOUNTER — Emergency Department (HOSPITAL_COMMUNITY): Payer: BLUE CROSS/BLUE SHIELD

## 2015-10-20 ENCOUNTER — Encounter (HOSPITAL_COMMUNITY): Payer: Self-pay | Admitting: Emergency Medicine

## 2015-10-20 DIAGNOSIS — Y998 Other external cause status: Secondary | ICD-10-CM | POA: Diagnosis not present

## 2015-10-20 DIAGNOSIS — J45909 Unspecified asthma, uncomplicated: Secondary | ICD-10-CM | POA: Insufficient documentation

## 2015-10-20 DIAGNOSIS — X58XXXA Exposure to other specified factors, initial encounter: Secondary | ICD-10-CM | POA: Diagnosis not present

## 2015-10-20 DIAGNOSIS — F17210 Nicotine dependence, cigarettes, uncomplicated: Secondary | ICD-10-CM | POA: Diagnosis not present

## 2015-10-20 DIAGNOSIS — Y9289 Other specified places as the place of occurrence of the external cause: Secondary | ICD-10-CM | POA: Insufficient documentation

## 2015-10-20 DIAGNOSIS — S8991XA Unspecified injury of right lower leg, initial encounter: Secondary | ICD-10-CM | POA: Insufficient documentation

## 2015-10-20 DIAGNOSIS — M25561 Pain in right knee: Secondary | ICD-10-CM

## 2015-10-20 DIAGNOSIS — Y9389 Activity, other specified: Secondary | ICD-10-CM | POA: Insufficient documentation

## 2015-10-20 MED ORDER — MELOXICAM 7.5 MG PO TABS: 15.0000 mg | ORAL_TABLET | Freq: Every day | ORAL | Status: AC

## 2015-10-20 MED ORDER — HYDROMORPHONE HCL 1 MG/ML IJ SOLN: 1.0000 mg | Freq: Once | INTRAMUSCULAR | Status: DC

## 2015-10-20 MED ORDER — OXYCODONE-ACETAMINOPHEN 5-325 MG PO TABS
1.0000 | ORAL_TABLET | Freq: Once | ORAL | Status: AC
  Administered 2015-10-20: 1 via ORAL
  Filled 2015-10-20: qty 1

## 2015-10-20 MED ORDER — OXYCODONE-ACETAMINOPHEN 5-325 MG PO TABS: 1.0000 | ORAL_TABLET | ORAL | Status: AC | PRN

## 2015-10-20 MED ORDER — HYDROMORPHONE HCL 1 MG/ML IJ SOLN
1.0000 mg | Freq: Once | INTRAMUSCULAR | Status: AC
Start: 2015-10-20 — End: 2015-10-20
  Administered 2015-10-20: 1 mg via INTRAMUSCULAR
  Filled 2015-10-20: qty 1

## 2015-10-20 MED ORDER — MELOXICAM 15 MG PO TABS
15.0000 mg | ORAL_TABLET | Freq: Once | ORAL | Status: AC
  Administered 2015-10-20: 15 mg via ORAL
  Filled 2015-10-20: qty 1

## 2015-10-20 NOTE — ED Notes (Signed)
Pt states that she was playing kickball and went to kick the ball and her foot stopped but her knee continued going forward, causing her pain. Alert and oriented.

## 2015-10-20 NOTE — Discharge Instructions (Signed)
Wear a knee immobilizer until you follow up with an orthopedic specialist. You may remove this 2-3 times per day for stretching of your calf and then reapply the immobilizer. Ice your knee 3-4 times per day for 15-20 minutes each time. Take mobic as prescribed and percocet as needed for severe pain. Make an appointment with Dr. Eulah PontMurphy for recheck. Return as needed if symptoms worsen.  RICE for Routine Care of Injuries Theroutine careofmanyinjuriesincludes rest, ice, compression, and elevation (RICE therapy). RICE therapy is often recommended for injuries to soft tissues, such as a muscle strain, ligament injuries, bruises, and overuse injuries. It can also be used for some bony injuries. Using RICE therapy can help to relieve pain, lessen swelling, and enable your body to heal. Rest Rest is required to allow your body to heal. This usually involves reducing your normal activities and avoiding use of the injured part of your body. Generally, you can return to your normal activities when you are comfortable and have been given permission by your health care provider. Ice Icing your injury helps to keep the swelling down, and it lessens pain. Do not apply ice directly to your skin.  Put ice in a plastic bag.  Place a towel between your skin and the bag.  Leave the ice on for 20 minutes, 2-3 times a day. Do this for as long as you are directed by your health care provider. Compression Compression means putting pressure on the injured area. Compression helps to keep swelling down, gives support, and helps with discomfort. Compression may be done with an elastic bandage. If an elastic bandage has been applied, follow these general tips:  Remove and reapply the bandage every 3-4 hours or as directed by your health care provider.  Make sure the bandage is not wrapped too tightly, because this can cut off circulation. If part of your body beyond the bandage becomes blue, numb, cold, swollen, or more  painful, your bandage is most likely too tight. If this occurs, remove your bandage and reapply it more loosely.  See your health care provider if the bandage seems to be making your problems worse rather than better. Elevation Elevation means keeping the injured area raised. This helps to lessen swelling and decrease pain. If possible, your injured area should be elevated at or above the level of your heart or the center of your chest. WHEN SHOULD I SEEK MEDICAL CARE? You should seek medical care if:  Your pain and swelling continue.  Your symptoms are getting worse rather than improving. These symptoms may indicate that further evaluation or further X-rays are needed. Sometimes, X-rays may not show a small broken bone (fracture) until a number of days later. Make a follow-up appointment with your health care provider. WHEN SHOULD I SEEK IMMEDIATE MEDICAL CARE? You should seek immediate medical care if:  You have sudden severe pain at or below the area of your injury.  You have redness or increased swelling around your injury.  You have tingling or numbness at or below the area of your injury that does not improve after you remove the elastic bandage.   This information is not intended to replace advice given to you by your health care provider. Make sure you discuss any questions you have with your health care provider.   Document Released: 11/15/2000 Document Revised: 04/24/2015 Document Reviewed: 07/11/2014 Elsevier Interactive Patient Education [employer]2016 [employer] Inc[employer]

## 2015-10-20 NOTE — ED Provider Notes (Signed)
History  By signing my name below, I, Karle Plumber, attest that this documentation has been prepared under the direction and in the presence of [employer], PA-C. Electronically Signed: Karle Plumber, ED Scribe. 10/20/2015. 10:38 PM.  Chief Complaint  Patient presents with  . Knee Injury   HPI  HPI Comments:  Allison Cross is a 40 y.o. female who presents to the Emergency Department complaining of right knee pain that began about 5 hours ago. She reports the pain is aching and rates it at 8/10 at rest. She reports some intermittent sharp pain and reports some paresthesias in the toes of the right foot. She states she was running while playing kick ball and stopped suddenly to kick the ball, but states it felt as if her knee kept moving forward. She has applied a knee brace but has not taken anything for pain. Bearing weight, bending and moving the right knee increases her pain. She denies alleviating factors. She denies fever, chills, nausea, vomiting, HA, numbness, tingling or weakness of the RLE. She denies any other injuries. She denies any previous knee injuries.   Past Medical History  Diagnosis Date  . Asthma    History reviewed. No pertinent past surgical history. History reviewed. No pertinent family history. Social History  Substance Use Topics  . Smoking status: Current Every Day Smoker -- 0.20 packs/day    Types: Cigarettes  . Smokeless tobacco: Never Used  . Alcohol Use: Yes     Comment: socially   OB History    No data available      Review of Systems  Musculoskeletal: Positive for joint swelling and arthralgias.  All other systems reviewed and are negative.   Allergies  Review of patient's allergies indicates no known allergies.  Home Medications   Prior to Admission medications   Medication Sig Start Date End Date Taking? Authorizing Provider  DM-Phenylephrine-Acetaminophen (TYLENOL COLD MULTI-SYMPTOM) 10-5-325 MG/15ML LIQD Take 5 mLs by mouth every  6 (six) hours as needed (cough).    Historical Provider, MD  etonogestrel-ethinyl estradiol (NUVARING) 0.12-0.015 MG/24HR vaginal ring Place 1 each vaginally every 28 (twenty-eight) days. Insert vaginally and leave in place for 3 consecutive weeks, then remove for 1 week.    Historical Provider, MD  loratadine (CLARITIN) 10 MG tablet Take 1 tablet (10 mg total) by mouth daily. Patient not taking: Reported on 10/20/2015 05/14/14   Antony Madura, PA-C  meloxicam (MOBIC) 7.5 MG tablet Take 2 tablets (15 mg total) by mouth daily. 10/20/15   Antony Madura, PA-C  oxyCODONE-acetaminophen (PERCOCET/ROXICET) 5-325 MG tablet Take 1 tablet by mouth every 4 (four) hours as needed for severe pain. 10/20/15   Antony Madura, PA-C  predniSONE (DELTASONE) 20 MG tablet Take 2 tablets (40 mg total) by mouth daily. Patient not taking: Reported on 10/20/2015 05/14/14   Antony Madura, PA-C   Triage Vitals: BP 120/82 mmHg  Pulse 80  Temp(Src) 97.6 F (36.4 C) (Oral)  Resp 16  Ht  (1.6 m)  Wt 170 lb (77.111 kg)  BMI 30.12 kg/m2  SpO2 99%  LMP 10/02/2015 (Approximate)  Physical Exam  Constitutional: She is oriented to person, place, and time. She appears well-developed and well-nourished. No distress.  HENT:  Head: Normocephalic and atraumatic.  Eyes: Conjunctivae and EOM are normal. No scleral icterus.  Neck: Normal range of motion.  Cardiovascular: Normal rate, regular rhythm and intact distal pulses.   DP and PT pulses 2+ in the right lower extremity  Pulmonary/Chest: Effort normal. No respiratory  distress.  Respirations even and unlabored.  Musculoskeletal:       Right knee: She exhibits decreased range of motion (decreased AROM secondary to pain), swelling and effusion (mild, lateral). She exhibits no deformity, no erythema, normal alignment and normal patellar mobility. Tenderness found. Lateral joint line and LCL tenderness noted. No medial joint line and no MCL tenderness noted.       Legs: Neurological: She is  alert and oriented to person, place, and time. She exhibits normal muscle tone. Coordination normal.  Sensation to light touch intact in the right lower extremity. Patient able to wiggle all toes. Patellar reflex intact.  Skin: Skin is warm and dry. No rash noted. She is not diaphoretic. No erythema. No pallor.  Psychiatric: She has a normal mood and affect. Her behavior is normal.  Nursing note and vitals reviewed.   ED Course  Procedures (including critical care time) DIAGNOSTIC STUDIES: Oxygen Saturation is 99% on RA, normal by my interpretation.   COORDINATION OF CARE: 10:35 PM- Will refer to orthopedist and order knee immobilizer and crutches. Will order injection of Dilaudid for pain prior to discharge. Advised pt to RICE the right knee and will prescribe Mobic. Pt verbalizes understanding and agrees to plan.   Medications  oxyCODONE-acetaminophen (PERCOCET/ROXICET) 5-325 MG per tablet 1 tablet (1 tablet Oral Given 10/20/15 2256)  meloxicam (MOBIC) tablet 15 mg (15 mg Oral Given 10/20/15 2256)  HYDROmorphone (DILAUDID) injection 1 mg (1 mg Intramuscular Given 10/20/15 2256)    Labs Review Labs Reviewed - No data to display  Imaging Review Dg Knee Complete 4 Views Right  10/20/2015  CLINICAL DATA:  Right knee pain. Twisting injury playing kickball today. Lateral knee pain. EXAM: RIGHT KNEE - COMPLETE 4+ VIEW COMPARISON:  None. FINDINGS: No fracture or dislocation. The alignment and joint spaces are maintained. Small joint effusion. IMPRESSION: No fracture or dislocation.  Small joint effusion. Electronically Signed   By: Rubye OaksMelanie  Ehinger M.D.   On: 10/20/2015 21:37   I have personally reviewed and evaluated these images and lab results as part of my medical decision-making.   EKG Interpretation None      MDM   Final diagnoses:  Right knee pain    40 year old female presents to the emergency department for acute onset of right knee pain which began while playing kickball.  Patient is neurovascularly intact. No concern for septic joint. X-ray negative for fracture, dislocation, or bony deformity. Patient most tender along the lateral joint line. High suspicion for lateral meniscal injury, though unable to exclude ACL/PCL component.  Patient placed in knee immobilizer and given crutches for weightbearing as tolerated. Will refer to orthopedics for follow-up. Have recommended RICE and NSAIDs with a short course of Percocet prescribed for pain control. Return precautions given at discharge. Patient agreeable to plan with no unaddressed concerns; discharged in satisfactory condition.  I personally performed the services described in this documentation, which was scribed in my presence. The recorded information has been reviewed and is accurate.    Filed Vitals:   10/20/15 2055 10/20/15 2302  BP: 120/82   Pulse: 80 79  Temp: 97.6 F (36.4 C) 97.8 F (36.6 C)  TempSrc: Oral Oral  Resp: 16 15  Height: 5\' 3"  (1.6 m)   Weight: 77.111 kg   SpO2: 99% 97%       Antony MaduraKelly Lekia Nier, PA-C 10/20/15 2328  Melene Planan Floyd, DO 10/21/15 1556

## 2017-12-12 IMAGING — CR DG KNEE COMPLETE 4+V*R*
4 series · 4 of 4 positions shown · non-contrast
Comparison: None.

CLINICAL DATA: Right knee pain. Twisting injury playing kickball
today. Lateral knee pain.

EXAM:
RIGHT KNEE - COMPLETE 4+ VIEW

[x knee ap right (1 of 3)]
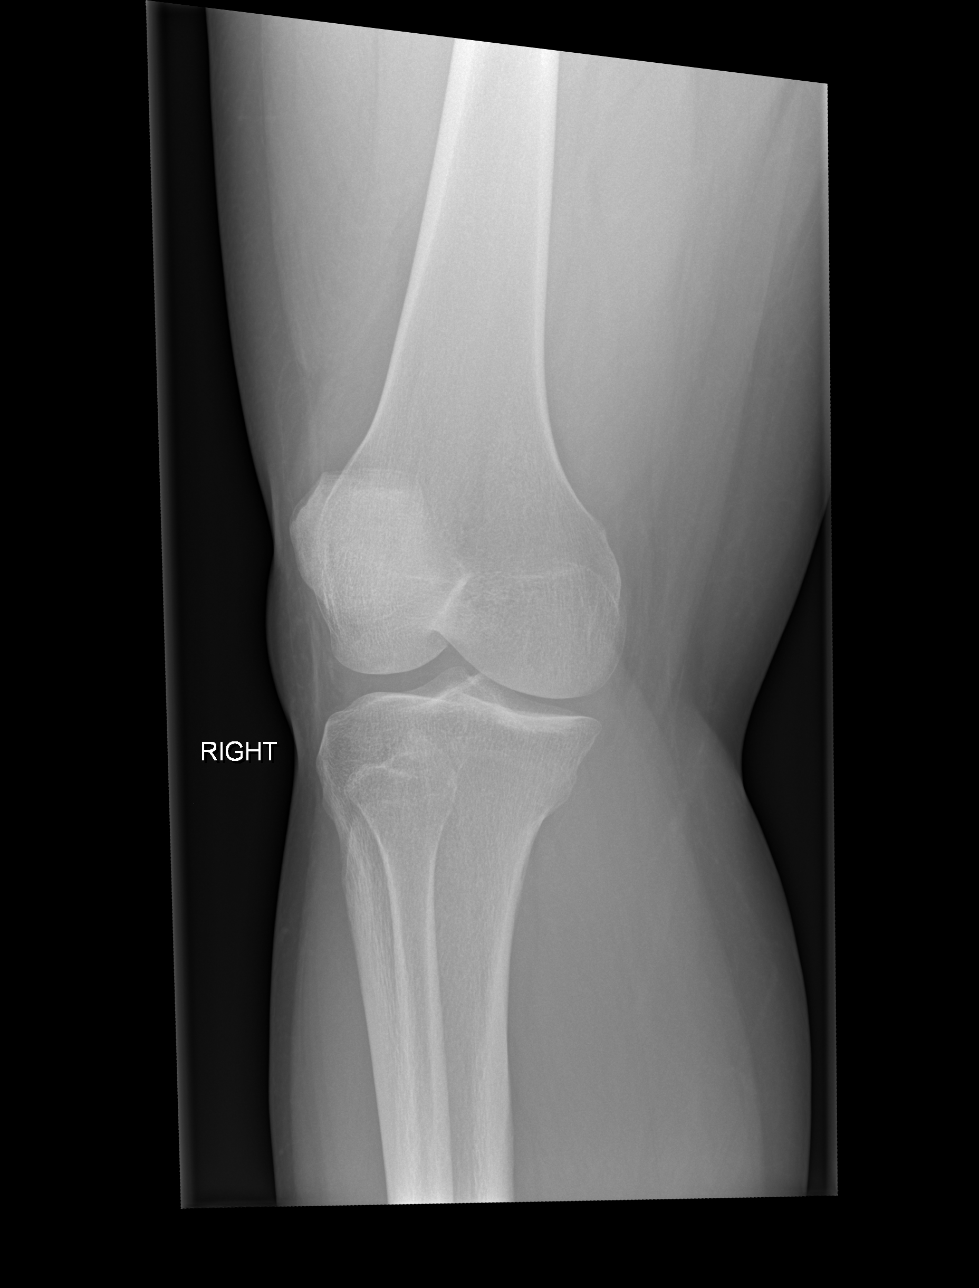

[x knee ap right (2 of 3)]
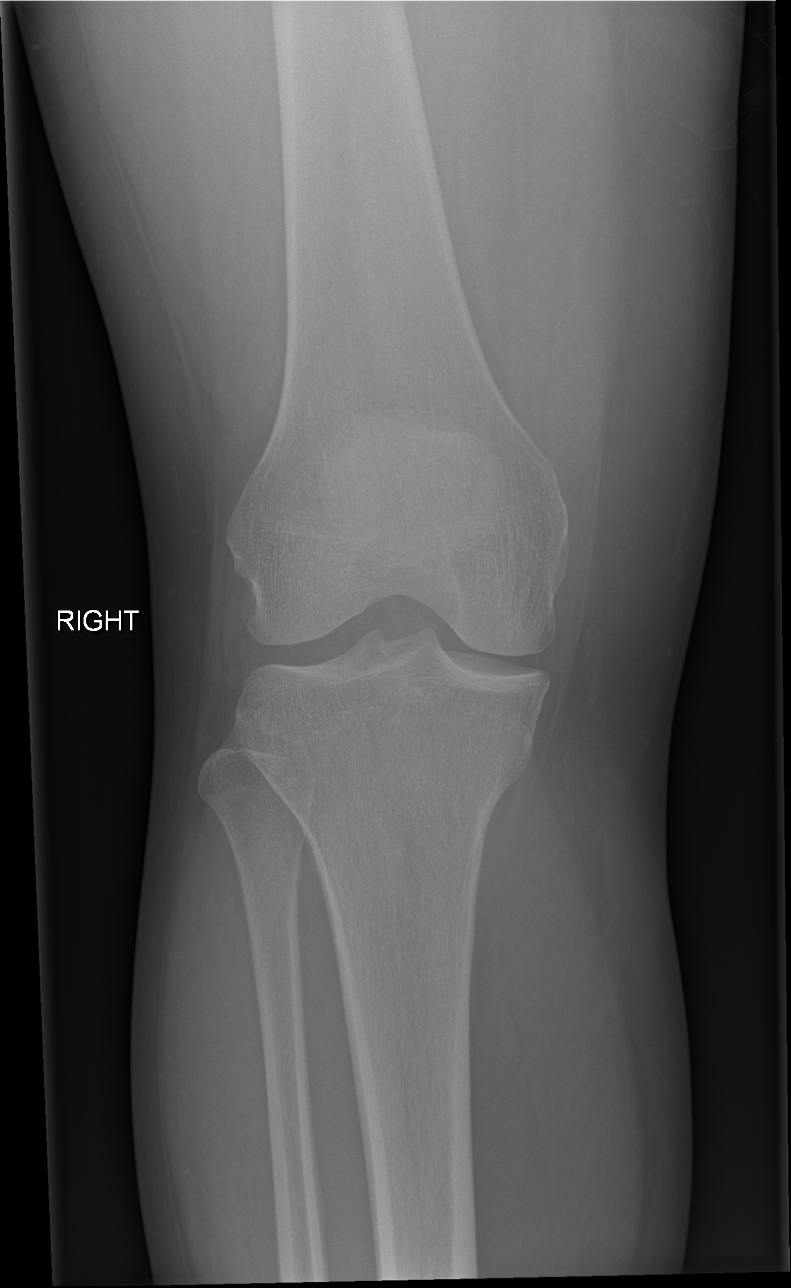

[x knee ap right (3 of 3)]
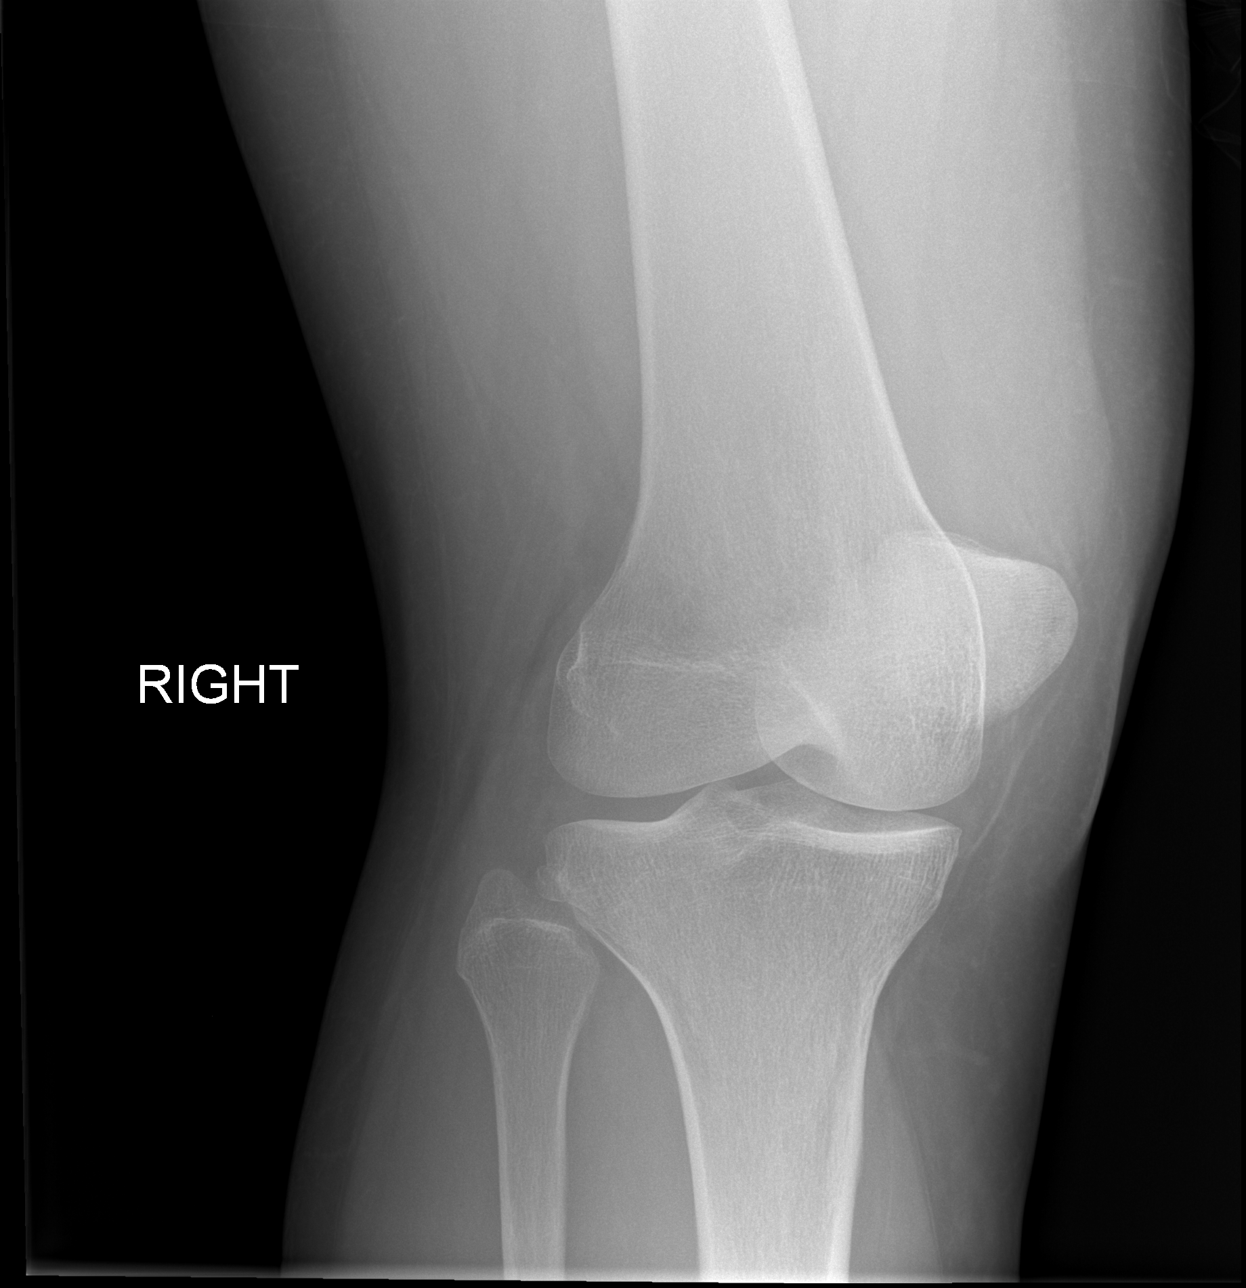

[x knee lat right]
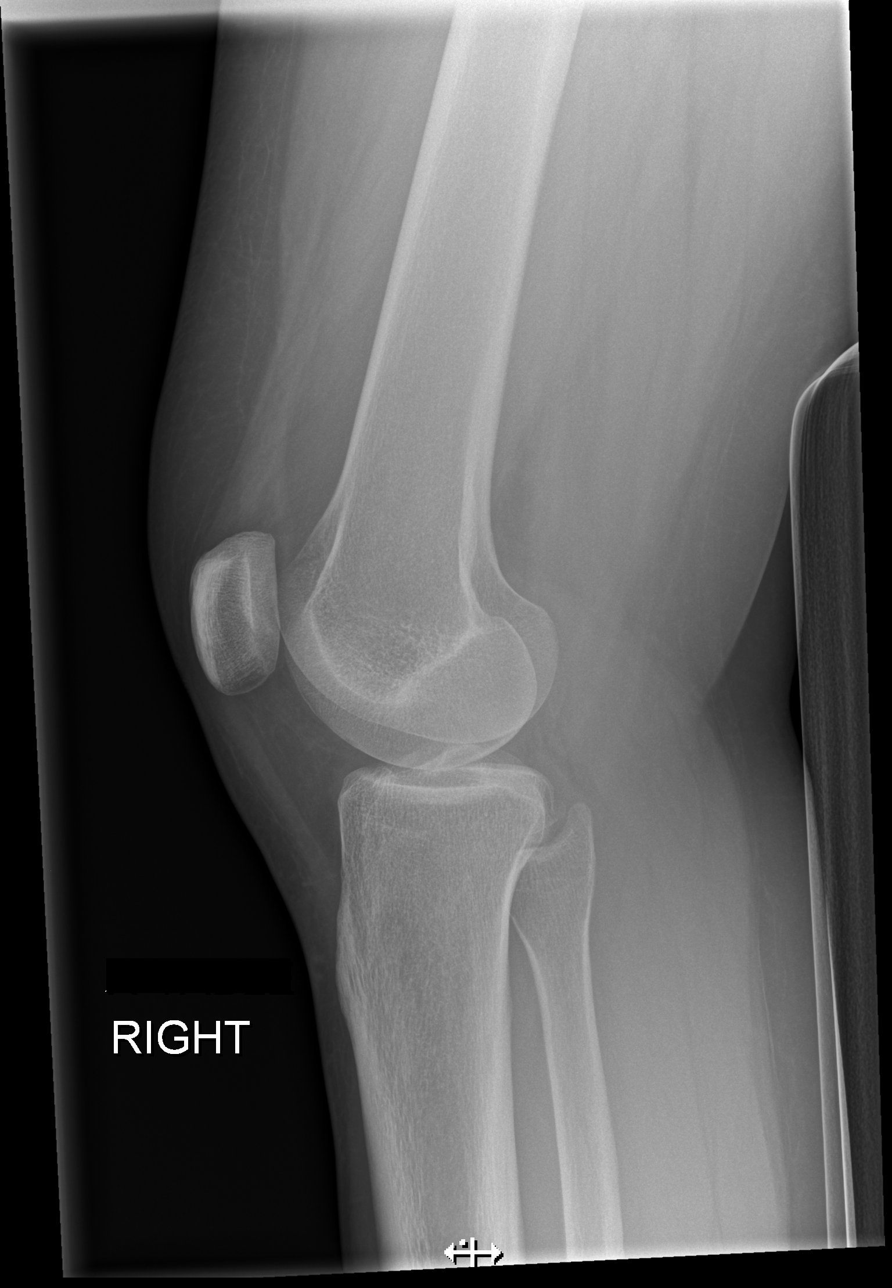

[4 of 4 positions shown; findings below may reference images not displayed]

FINDINGS: No fracture or dislocation. The alignment and joint spaces are
maintained. Small joint effusion.
IMPRESSION: No fracture or dislocation.  Small joint effusion.
